# Patient Record
Sex: Female | Born: 1935 | Race: Asian | Hispanic: Yes | Smoking: Never smoker
Health system: Southern US, Community
[De-identification: ages and names within clinical notes are randomized; demographics above are authoritative.]

## PROBLEM LIST (undated history)

## (undated) DIAGNOSIS — I10 Essential (primary) hypertension: Secondary | ICD-10-CM

## (undated) HISTORY — PX: CATARACT EXTRACTION: SUR2

## (undated) HISTORY — PX: REPLACEMENT TOTAL KNEE: SUR1224

---

## 2014-08-09 ENCOUNTER — Emergency Department (HOSPITAL_COMMUNITY): Payer: BLUE CROSS/BLUE SHIELD

## 2014-08-09 ENCOUNTER — Emergency Department (HOSPITAL_COMMUNITY)
Admission: EM | Admit: 2014-08-09 | Discharge: 2014-08-10 | Disposition: A | Discharge status: Home / Self Care | Payer: BLUE CROSS/BLUE SHIELD | Attending: Emergency Medicine | Admitting: Emergency Medicine

## 2014-08-09 ENCOUNTER — Encounter (HOSPITAL_COMMUNITY): Payer: Self-pay | Admitting: Emergency Medicine

## 2014-08-09 ENCOUNTER — Emergency Department (HOSPITAL_COMMUNITY)
Admission: EM | Admit: 2014-08-09 | Discharge: 2014-08-09 | Disposition: A | Discharge status: Nursing Home (Includes ICF) | Payer: BLUE CROSS/BLUE SHIELD | Source: Home / Self Care | Attending: Family Medicine | Admitting: Family Medicine

## 2014-08-09 DIAGNOSIS — I158 Other secondary hypertension: Secondary | ICD-10-CM | POA: Diagnosis not present

## 2014-08-09 DIAGNOSIS — Z7901 Long term (current) use of anticoagulants: Secondary | ICD-10-CM | POA: Insufficient documentation

## 2014-08-09 DIAGNOSIS — Z7982 Long term (current) use of aspirin: Secondary | ICD-10-CM | POA: Insufficient documentation

## 2014-08-09 DIAGNOSIS — I10 Essential (primary) hypertension: Secondary | ICD-10-CM

## 2014-08-09 DIAGNOSIS — Z79899 Other long term (current) drug therapy: Secondary | ICD-10-CM | POA: Diagnosis not present

## 2014-08-09 DIAGNOSIS — I4891 Unspecified atrial fibrillation: Secondary | ICD-10-CM | POA: Diagnosis not present

## 2014-08-09 DIAGNOSIS — I499 Cardiac arrhythmia, unspecified: Secondary | ICD-10-CM | POA: Insufficient documentation

## 2014-08-09 HISTORY — DX: Essential (primary) hypertension: I10

## 2014-08-09 LAB — BASIC METABOLIC PANEL
Anion gap: 15 (ref 5–15)
BUN: 14 mg/dL (ref 6–23)
CALCIUM: 9.4 mg/dL (ref 8.4–10.5)
CO2: 22 mmol/L (ref 19–32)
CREATININE: 0.8 mg/dL (ref 0.50–1.10)
Chloride: 104 mmol/L (ref 96–112)
GFR calc Af Amer: 80 mL/min — ABNORMAL LOW (ref >90)
GFR calc non Af Amer: 69 mL/min — ABNORMAL LOW (ref >90)
GLUCOSE: 110 mg/dL — AB (ref 70–99)
Potassium: 3.7 mmol/L (ref 3.5–5.1)
SODIUM: 141 mmol/L (ref 135–145)

## 2014-08-09 LAB — CBC
HEMATOCRIT: 44.1 % (ref 36.0–46.0)
Hemoglobin: 14.4 g/dL (ref 12.0–15.0)
MCH: 29.2 pg (ref 26.0–34.0)
MCHC: 32.7 g/dL (ref 30.0–36.0)
MCV: 89.5 fL (ref 78.0–100.0)
PLATELETS: 213 10*3/uL (ref 150–400)
RBC: 4.93 MIL/uL (ref 3.87–5.11)
RDW: 13.2 % (ref 11.5–15.5)
WBC: 6.4 10*3/uL (ref 4.0–10.5)

## 2014-08-09 LAB — I-STAT TROPONIN, ED: Troponin i, poc: 0 ng/mL (ref 0.00–0.08)

## 2014-08-09 NOTE — ED Provider Notes (Signed)
CSN: [id number]     Arrival date & time 08/09/14  1900 History   First MD Initiated Contact with Patient 08/09/14 1949     Chief Complaint  Patient presents with  . Hypertension   (Consider location/radiation/quality/duration/timing/severity/associated sxs/prior Treatment) Patient is a 79 y.o. female presenting with hypertension. The history is provided by the patient.  Hypertension This is a chronic problem. The current episode started 12 to 24 hours ago. The problem has been gradually worsening. Associated symptoms include chest pain and headaches.    Past Medical History  Diagnosis Date  . Hypertension    Past Surgical History  Procedure Laterality Date  . Replacement total knee    . Cataract extraction     History reviewed. No pertinent family history. History  Substance Use Topics  . Smoking status: Never Smoker   . Smokeless tobacco: Not on file  . Alcohol Use: No   OB History    No data available     Review of Systems  Constitutional: Negative.   Cardiovascular: Positive for chest pain and leg swelling.  Neurological: Positive for headaches.    Allergies  Review of patient's allergies indicates no known allergies.  Home Medications   Prior to Admission medications   Medication Sig Start Date End Date Taking? Authorizing Provider  amLODipine (NORVASC) 2.5 MG tablet Take 2.5 mg by mouth daily.   Yes Historical Provider, MD  apixaban (ELIQUIS) 5 MG TABS tablet Take 5 mg by mouth 2 (two) times daily.   Yes Historical Provider, MD  aspirin EC 81 MG tablet Take 81 mg by mouth daily.   Yes Historical Provider, MD  CLONIDINE HCL PO Take by mouth.   Yes Historical Provider, MD  metoprolol tartrate (LOPRESSOR) 25 MG tablet Take 25 mg by mouth 2 (two) times daily.   Yes Historical Provider, MD   BP 194/124 mmHg  Pulse 89  Temp(Src) 98.1 F (36.7 C) (Oral)  Resp 18  SpO2 97% Physical Exam  Constitutional: She is oriented to person, place, and time. She appears  well-developed and well-nourished.  Eyes: Pupils are equal, round, and reactive to light.  Neck: Normal range of motion. Neck supple. No JVD present.  Cardiovascular: Intact distal pulses and normal pulses.  An irregularly irregular rhythm present. Frequent extrasystoles are present.  No murmur heard. Pulmonary/Chest: Effort normal and breath sounds normal.  Musculoskeletal: She exhibits edema.  Lymphadenopathy:    She has no cervical adenopathy.  Neurological: She is alert and oriented to person, place, and time.  Skin: Skin is warm and dry.  Nursing note and vitals reviewed.   ED Course  Procedures (including critical care time) Labs Review Labs Reviewed - No data to display  Imaging Review No results found.   MDM   1. Hypertension, accelerated   2. Atrial fibrillation, controlled        Linna HoffJames D Iverson Sees, MD 08/09/14 2033

## 2014-08-09 NOTE — ED Notes (Addendum)
Htn, noted she is visiting family in [place].  Patient is from [place].  Patient has been checking blood pressure recently.  Noted blood pressure elevated today.  Denies pain. Reports having taken 2 extra norvasc and 1 clonidine ( does not remember doses of medicine)

## 2014-08-09 NOTE — ED Provider Notes (Signed)
CSN: [id number]     Arrival date & time 08/09/14  2042 History   First MD Initiated Contact with Patient 08/09/14 2347     This chart was scribed for Tomasita CrumbleAdeleke Doreena Maulden, MD by Arlan OrganAshley Leger, ED Scribe. This patient was seen in room A12C/A12C and the patient's care was started 12:00 AM.   Chief Complaint  Patient presents with  . Hypertension   HPI  HPI Comments: Colleen Hutchinson is a 79 y.o. female with a PMHx of HTN who presents to the Emergency Department here for hypertension this evening. Pt was sent from Urgent Care this evening for further evaluation of her blood pressure after a systolic reading of 200 earlier today while in office. While at home, pt states her blood pressure was 176/194 and says systolic is typically around 140. Pt admits to taking two extra doses of Norvasc today along with one dose of Clonidine in hopes of getting her blood pressure to go down. She reports being started on a new medication for hypertension recently. [name] denies any dizziness, HA, or pain at this time. Pt [place] in [place] and originally [place]. She is followed by a PCP in her home town and wishes to follow up when she returns. Pt requesting refill of Norvasc this evening. No known allergies to medications.   Past Medical History  Diagnosis Date  . Hypertension    Past Surgical History  Procedure Laterality Date  . Replacement total knee    . Cataract extraction     No family history on file. History  Substance Use Topics  . Smoking status: Never Smoker   . Smokeless tobacco: Not on file  . Alcohol Use: No   OB History    No data available     Review of Systems  Constitutional: Negative for fever and chills.  Respiratory: Negative for shortness of breath.   Cardiovascular: Negative for chest pain and leg swelling.  Neurological: Negative for dizziness and light-headedness.  All other systems reviewed and are negative.     Allergies  Review of patient's  allergies indicates no known allergies.  Home Medications   Prior to Admission medications   Medication Sig Start Date End Date Taking? Authorizing Provider  amLODipine (NORVASC) 2.5 MG tablet Take 2.5 mg by mouth daily.   Yes Historical Provider, MD  aspirin EC 81 MG tablet Take 81 mg by mouth daily.   Yes Historical Provider, MD  metoprolol tartrate (LOPRESSOR) 25 MG tablet Take 25 mg by mouth 2 (two) times daily.   Yes Historical Provider, MD  rivaroxaban (XARELTO) 10 MG TABS tablet Take 10 mg by mouth daily.   Yes Historical Provider, MD   Triage Vitals: BP 145/79 mmHg  Pulse 82  Temp(Src) 98 F (36.7 C) (Oral)  Resp 18  Ht 5\' 4"  (1.626 m)  Wt 215 lb (97.523 kg)  BMI 36.89 kg/m2  SpO2 98%   Physical Exam  Constitutional: She is oriented to person, place, and time. She appears well-developed and well-nourished. No distress.  HENT:  Head: Normocephalic and atraumatic.  Eyes: EOM are normal.  Neck: Normal range of motion.  Cardiovascular: Normal rate and normal heart sounds.  An irregular rhythm present.  Irregularly irregular rhythm   Pulmonary/Chest: Effort normal and breath sounds normal.  Abdominal: Soft. She exhibits no distension. There is no tenderness.  Musculoskeletal: Normal range of motion.  Neurological: She is alert and oriented to person, place, and time.  Skin: Skin is warm  and dry.  Psychiatric: She has a normal mood and affect. Judgment normal.  Nursing note and vitals reviewed.   ED Course  Procedures (including critical care time)  DIAGNOSTIC STUDIES: Oxygen Saturation is 98% on RA, Normal by my interpretation.    COORDINATION OF CARE: 11:59 PM-Discussed treatment plan with pt at bedside and pt agreed to plan.     Labs Review Labs Reviewed  BASIC METABOLIC PANEL - Abnormal; Notable for the following:    Glucose, Bld 110 (*)    GFR calc non Af Amer 69 (*)    GFR calc Af Amer 80 (*)    All other components within normal limits  CBC  I-STAT  TROPOININ, ED    Imaging Review Dg Chest 2 View  08/09/2014   CLINICAL DATA:  Shortness of Breath  EXAM: CHEST  2 VIEW  COMPARISON:  None.  FINDINGS: Cardiac shadow is within normal limits. Mild interstitial changes are seen which may represent some mild early vascular congestion. No focal infiltrate or sizable effusion is noted. Degenerative change of thoracic spine is noted.  IMPRESSION: Mild interstitial changes without focal infiltrate.   Electronically Signed   By: Alcide Clever M.D.   On: 08/09/2014 21:54     EKG Interpretation None      MDM   Final diagnoses:  None    Patients BP has returned below 200 systolic spontaneously without intervention.  She states she normally takes amlodipine 2.5mg  per day and she takes a dose of clonidine when her BP gets high PRN.  I do not agree with this medication as it causes rebound HTN, however she states this was prescribed by her doctor in [place].  She was advised to increase her amlodipine to  per day, a Rx was given, and to continue to follow her PCP's instructions since they know her the best.  She is currently denying any symptoms of hypertensive emergency in the ED.  Her VS remain within her normal limits and she is safe for DC.  Tomasita Crumble, MD 08/12/14 [phone]

## 2014-08-09 NOTE — ED Notes (Addendum)
[facility]- pt reports being on new medication for hypertension and she checks BP daily- this morning BP was 200 systolic at home.  Pt denies dizziness, headaches, or pain.  Pt has taken extra BP today.  [facility] showed [facility], pt denies hx, denies CP.

## 2014-08-10 MED ORDER — AMLODIPINE BESYLATE 2.5 MG PO TABS: 5.0000 mg | ORAL_TABLET | Freq: Every day | ORAL | Status: AC

## 2014-08-10 NOTE — Discharge Instructions (Signed)
How to Take Your Blood Pressure Colleen Hutchinson, take amlodipine 5mg  per day until you see your regular physician.  If any symptoms worsen, come back to the ED immediately.  Your blood pressure here is back down to a safe range.  Below are instructions on how to properly take your blood pressure. Thank you. HOW DO I GET A BLOOD PRESSURE MACHINE?  You can buy an electronic home blood pressure machine at your local pharmacy. Insurance will sometimes cover the cost if you have a prescription.  Ask your doctor what type of machine is best for you. There are different machines for your arm and your wrist.  If you decide to buy a machine to check your blood pressure on your arm, first check the size of your arm so you can buy the right size cuff. To check the size of your arm:   Use a measuring tape that shows both inches and centimeters.   Wrap the measuring tape around the upper-middle part of your arm. You may need someone to help you measure.   Write down your arm measurement in both inches and centimeters.   To measure your blood pressure correctly, it is important to have the right size cuff.   If your arm is up to 13 inches (up to 34 centimeters), get an adult cuff size.  If your arm is 13 to 17 inches (35 to 44 centimeters), get a large adult cuff size.    If your arm is 17 to 20 inches (45 to 52 centimeters), get an adult thigh cuff.  WHAT DO THE NUMBERS MEAN?   There are two numbers that make up your blood pressure. For example: 120/80.  The first number (120 in our example) is called the "systolic pressure." It is a measure of the pressure in your blood vessels when your heart is pumping blood.  The second number (80 in our example) is called the "diastolic pressure." It is a measure of the pressure in your blood vessels when your heart is resting between beats.  Your doctor will tell you what your blood pressure should be. WHAT SHOULD I DO BEFORE I CHECK MY BLOOD PRESSURE?     Try to rest or relax for at least 30 minutes before you check your blood pressure.  Do not smoke.  Do not have any drinks with caffeine, such as:  Soda.  Coffee.  Tea.  Check your blood pressure in a quiet room.  Sit down and stretch out your arm on a table. Keep your arm at about the level of your heart. Let your arm relax.  Make sure that your legs are not crossed. HOW DO I CHECK MY BLOOD PRESSURE?  Follow the directions that came with your machine.  Make sure you remove any tight-fighting clothing from your arm or wrist. Wrap the cuff around your upper arm or wrist. You should be able to fit a finger between the cuff and your arm. If you cannot fit a finger between the cuff and your arm, it is too tight and should be removed and rewrapped.  Some units require you to manually pump up the arm cuff.  Automatic units inflate the cuff when you press a button.  Cuff deflation is automatic in both models.  After the cuff is inflated, the unit measures your blood pressure and pulse. The readings are shown on a monitor. Hold still and breathe normally while the cuff is inflated.  Getting a reading takes less than a  minute.  Some models store readings in a memory. Some provide a printout of readings. If your machine does not store your readings, keep a written record.  Take readings with you to your next visit with your doctor. Document Released: 04/02/2008 Document Revised: 09/04/2013 Document Reviewed: 06/15/2013 [facility] Patient Information 2015 [place], [place]. This information is not intended to replace advice given to you by your health care provider. Make sure you discuss any questions you have with your health care provider.

## 2016-10-19 IMAGING — DX DG CHEST 2V
2 series · 2 of 2 positions shown · non-contrast
Comparison: None.

CLINICAL DATA: Shortness of Breath

EXAM:
CHEST  2 VIEW

[chest pa]
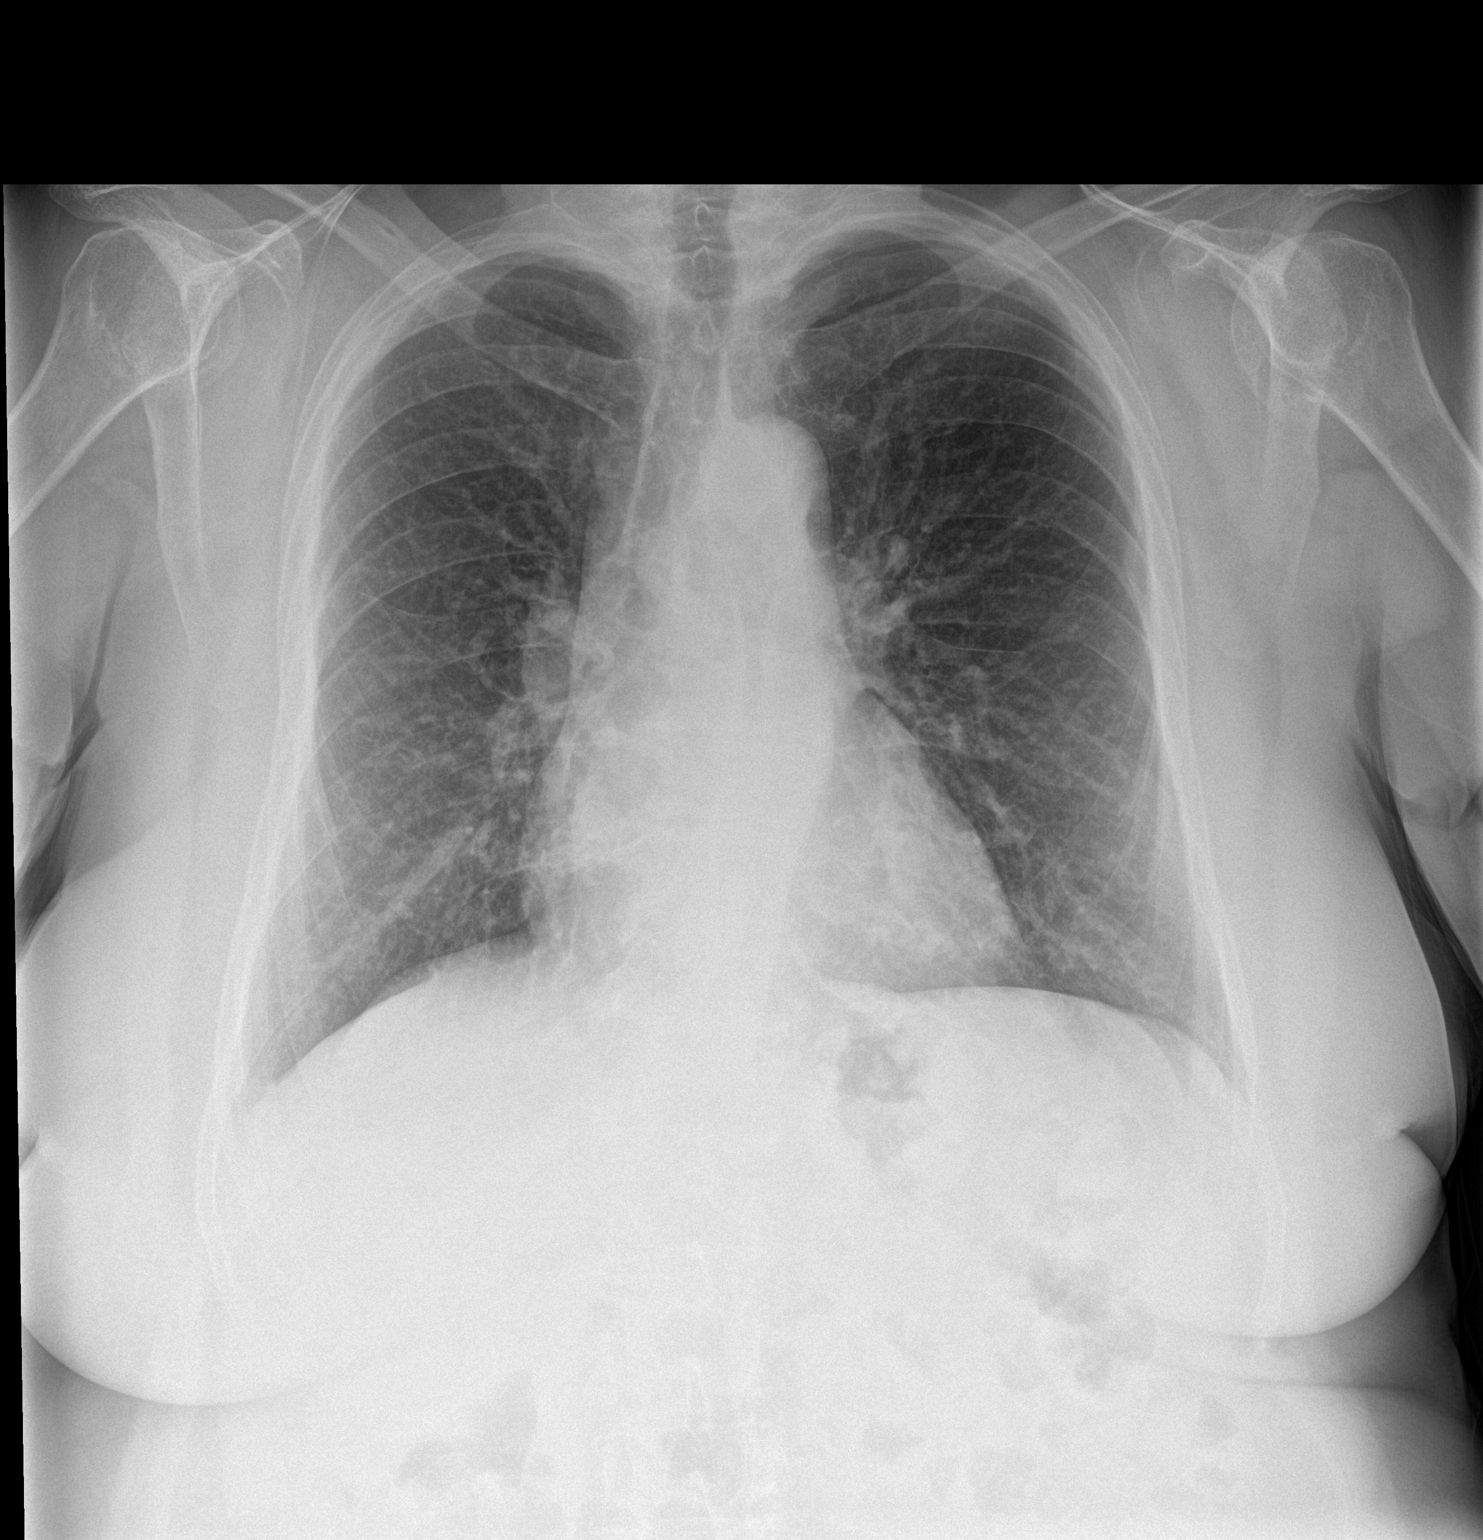

[chest lat]
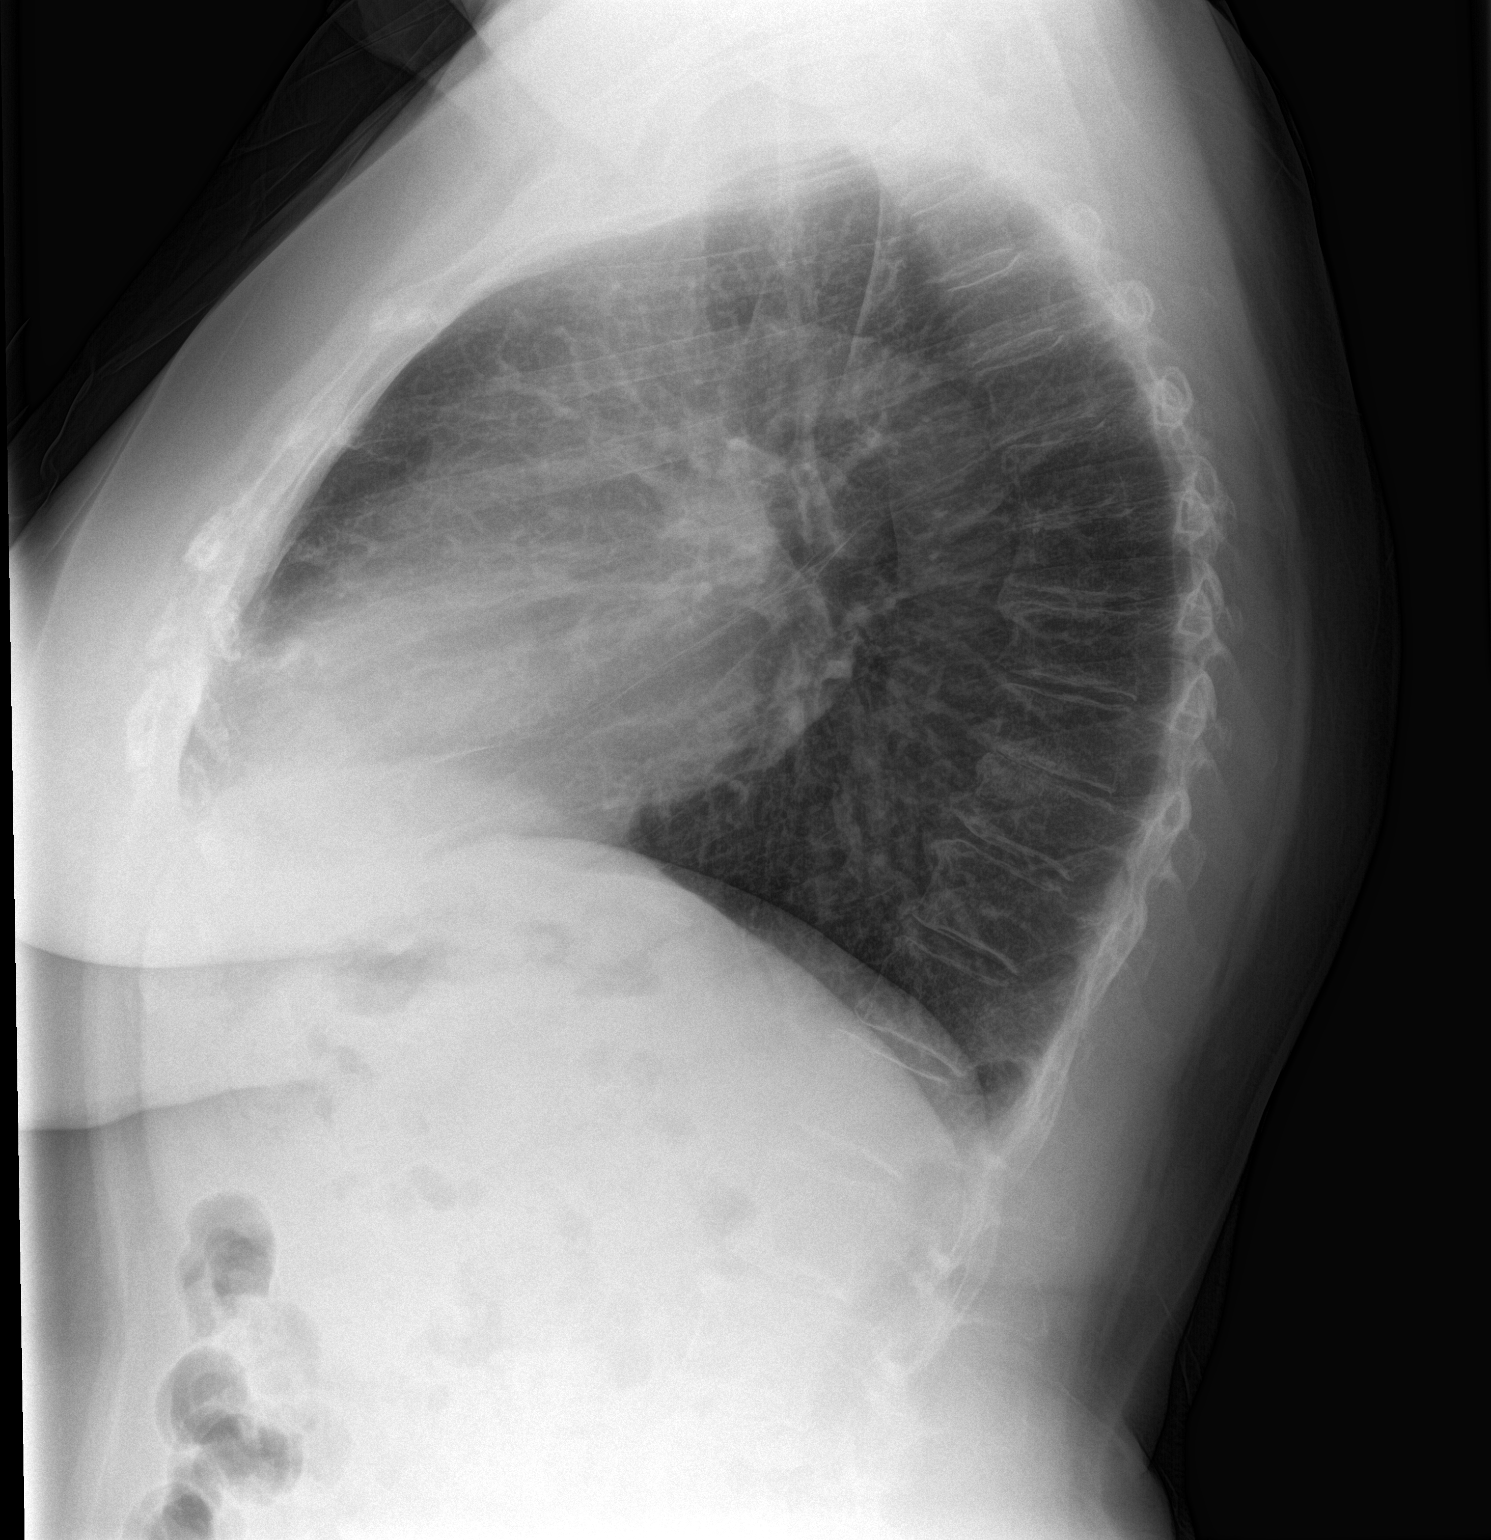

[2 of 2 positions shown; findings below may reference images not displayed]

FINDINGS: Cardiac shadow is within normal limits. Mild interstitial changes
are seen which may represent some mild early vascular congestion. No
focal infiltrate or sizable effusion is noted. Degenerative change
of thoracic spine is noted.
IMPRESSION: Mild interstitial changes without focal infiltrate.
